# Patient Record
Sex: Male | Born: 2000 | Hispanic: No | Marital: Single
Health system: Southern US, Community
[De-identification: ages and names within clinical notes are randomized; demographics above are authoritative.]

---
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
## 2021-05-26 ENCOUNTER — Emergency Department (HOSPITAL_COMMUNITY)
Admission: EM | Admit: 2021-05-26 | Discharge: 2021-05-27 | Disposition: A | Discharge status: Home / Self Care | Payer: Medicaid Other | Attending: Emergency Medicine | Admitting: Emergency Medicine

## 2021-05-26 ENCOUNTER — Other Ambulatory Visit: Payer: Self-pay

## 2021-05-26 DIAGNOSIS — R000 Tachycardia, unspecified: Secondary | ICD-10-CM | POA: Insufficient documentation

## 2021-05-26 DIAGNOSIS — R197 Diarrhea, unspecified: Secondary | ICD-10-CM | POA: Insufficient documentation

## 2021-05-26 DIAGNOSIS — E876 Hypokalemia: Secondary | ICD-10-CM | POA: Diagnosis not present

## 2021-05-26 DIAGNOSIS — R1084 Generalized abdominal pain: Secondary | ICD-10-CM | POA: Insufficient documentation

## 2021-05-26 DIAGNOSIS — R112 Nausea with vomiting, unspecified: Secondary | ICD-10-CM | POA: Diagnosis not present

## 2021-05-26 LAB — CBC WITH DIFFERENTIAL/PLATELET
Abs Immature Granulocytes: 0.02 10*3/uL (ref 0.00–0.07)
Basophils Absolute: 0 10*3/uL (ref 0.0–0.1)
Basophils Relative: 0 %
Eosinophils Absolute: 0 10*3/uL (ref 0.0–0.5)
Eosinophils Relative: 0 %
HCT: 46.1 % (ref 39.0–52.0)
Hemoglobin: 15.8 g/dL (ref 13.0–17.0)
Immature Granulocytes: 0 %
Lymphocytes Relative: 7 %
Lymphs Abs: 0.7 10*3/uL (ref 0.7–4.0)
MCH: 29.9 pg (ref 26.0–34.0)
MCHC: 34.3 g/dL (ref 30.0–36.0)
MCV: 87.3 fL (ref 80.0–100.0)
Monocytes Absolute: 0.7 10*3/uL (ref 0.1–1.0)
Monocytes Relative: 8 %
Neutro Abs: 7.7 10*3/uL (ref 1.7–7.7)
Neutrophils Relative %: 85 %
Platelets: 277 10*3/uL (ref 150–400)
RBC: 5.28 MIL/uL (ref 4.22–5.81)
RDW: 12.8 % (ref 11.5–15.5)
WBC: 9.1 10*3/uL (ref 4.0–10.5)
nRBC: 0 % (ref 0.0–0.2)

## 2021-05-26 LAB — COMPREHENSIVE METABOLIC PANEL
ALT: 22 U/L (ref 0–44)
AST: 21 U/L (ref 15–41)
Albumin: 4.6 g/dL (ref 3.5–5.0)
Alkaline Phosphatase: 53 U/L (ref 38–126)
Anion gap: 9 (ref 5–15)
BUN: 19 mg/dL (ref 6–20)
CO2: 25 mmol/L (ref 22–32)
Calcium: 9.3 mg/dL (ref 8.9–10.3)
Chloride: 103 mmol/L (ref 98–111)
Creatinine, Ser: 0.85 mg/dL (ref 0.61–1.24)
GFR, Estimated: >60 mL/min (ref >60)
Glucose, Bld: 133 mg/dL — ABNORMAL HIGH (ref 70–99)
Potassium: 3.4 mmol/L — ABNORMAL LOW (ref 3.5–5.1)
Sodium: 137 mmol/L (ref 135–145)
Total Bilirubin: 1 mg/dL (ref 0.3–1.2)
Total Protein: 8.1 g/dL (ref 6.5–8.1)

## 2021-05-26 LAB — LIPASE, BLOOD: Lipase: 24 U/L (ref 11–51)

## 2021-05-26 NOTE — ED Provider Triage Note (Signed)
Emergency Medicine Provider Triage Evaluation Note ? ?Mitchell Farmer , a 21 y.o. male  was evaluated in triage.  Pt complains of vaginal onset, constant, epigastric abdominal pain that began last night.  Patient also complains of nausea, nonbloody nonbilious emesis, fevers with a Tmax of 101.0.  He reports that he ate a salad last night however his friend ate the same thing without symptoms.  Denies any previous abdominal surgeries. ? ?Review of Systems  ?Positive: + abd pain, nausea, vomiting, fever ?Negative: - diarrhea ? ?Physical Exam  ?BP 113/76 (BP Location: Right Arm)   Pulse (!) 128   Temp 98.6 ?F (37 ?C) (Oral)   Resp 18   SpO2 94%  ?Gen:   Awake, no distress   ?Resp:  Normal effort  ?MSK:   Moves extremities without difficulty  ?Other:  Mild epigastric abd TTP ? ?Medical Decision Making  ?Medically screening exam initiated at 9:57 PM.  Appropriate orders placed.  Jensen Nevius was informed that the remainder of the evaluation will be completed by another provider, this initial triage assessment does not replace that evaluation, and the importance of remaining in the ED until their evaluation is complete. ? ? ?  ?Eustaquio Maize, PA-C ?05/26/21 2157 ? ?

## 2021-05-26 NOTE — ED Triage Notes (Signed)
Pt states that he ate something last night and has been vomiting and having abdominal pain since then.  ?

## 2021-05-27 LAB — URINALYSIS, ROUTINE W REFLEX MICROSCOPIC
Bacteria, UA: NONE SEEN
Bilirubin Urine: NEGATIVE
Glucose, UA: NEGATIVE mg/dL
Hgb urine dipstick: NEGATIVE
Ketones, ur: NEGATIVE mg/dL
Leukocytes,Ua: NEGATIVE
Nitrite: NEGATIVE
Protein, ur: 100 mg/dL — AB
Specific Gravity, Urine: 1.033 — ABNORMAL HIGH (ref 1.005–1.030)
pH: 5 (ref 5.0–8.0)

## 2021-05-27 MED ORDER — FAMOTIDINE 20 MG PO TABS: 20.0000 mg | ORAL_TABLET | Freq: Two times a day (BID) | ORAL | 0 refills | Status: AC | PRN

## 2021-05-27 MED ORDER — LIDOCAINE VISCOUS HCL 2 % MT SOLN
15.0000 mL | Freq: Once | OROMUCOSAL | Status: AC
  Administered 2021-05-27: 15 mL via ORAL
  Filled 2021-05-27: qty 15

## 2021-05-27 MED ORDER — ONDANSETRON HCL 4 MG/2ML IJ SOLN
4.0000 mg | Freq: Once | INTRAMUSCULAR | Status: AC
  Administered 2021-05-27: 4 mg via INTRAVENOUS
  Filled 2021-05-27: qty 2

## 2021-05-27 MED ORDER — SODIUM CHLORIDE 0.9 % IV BOLUS
1000.0000 mL | Freq: Once | INTRAVENOUS | Status: AC
  Administered 2021-05-27: 1000 mL via INTRAVENOUS

## 2021-05-27 MED ORDER — POTASSIUM CHLORIDE CRYS ER 20 MEQ PO TBCR
40.0000 meq | EXTENDED_RELEASE_TABLET | Freq: Once | ORAL | Status: AC
  Administered 2021-05-27: 40 meq via ORAL
  Filled 2021-05-27: qty 2

## 2021-05-27 MED ORDER — ONDANSETRON 4 MG PO TBDP: 4.0000 mg | ORAL_TABLET | Freq: Three times a day (TID) | ORAL | 0 refills | Status: DC | PRN

## 2021-05-27 MED ORDER — ALUM & MAG HYDROXIDE-SIMETH 200-200-20 MG/5ML PO SUSP
30.0000 mL | Freq: Once | ORAL | Status: AC
  Administered 2021-05-27: 30 mL via ORAL
  Filled 2021-05-27: qty 30

## 2021-05-27 MED ORDER — FAMOTIDINE IN NACL 20-0.9 MG/50ML-% IV SOLN
20.0000 mg | Freq: Once | INTRAVENOUS | Status: AC
  Administered 2021-05-27: 20 mg via INTRAVENOUS
  Filled 2021-05-27: qty 50

## 2021-05-27 NOTE — Discharge Instructions (Addendum)
You are seen in the emergency department today with vomiting, diarrhea, and abdominal pain.  Your blood work was overall reassuring, your potassium was mildly low and you do have some protein in your urine, please have these rechecked by your primary care provider. ?We are sending you home with the following medications to help with your symptoms:  ?- Pepcid- please take 1 tablet every 12 hours as needed for abdominal pain to help with stomach acidity/pain.  ?- Zofran- please take every 8 hours as needed for nausea/vomiting.  ? ?We have prescribed you new medication(s) today. Discuss the medications prescribed today with your pharmacist as they can have adverse effects and interactions with your other medicines including over the counter and prescribed medications. Seek medical evaluation if you start to experience new or abnormal symptoms after taking one of these medicines, seek care immediately if you start to experience difficulty breathing, feeling of your throat closing, facial swelling, or rash as these could be indications of a more serious allergic reaction ? ?Please follow attached diet guidelines.  ? ?Follow up with your primary care provider within 3 days for re-evaluation.  ?Return to the ER for new or worsening symptoms including but not limited to worsened pain, new pain, pain localizing to the right side of your abdomen, inability to keep fluids down, blood in vomit/stool, passing out, or any other concerns.  ? ?

## 2021-05-27 NOTE — ED Provider Notes (Signed)
?[facility]-EMERGENCY DEPT ?Provider Note ? ? ?CSN: [id number] ?Arrival date & time: 05/26/21  2050 ? ?  ? ?History ? ?Chief Complaint  ?Patient presents with  ? Abdominal Pain  ? Emesis  ? ? ?Mitchell Farmer is a 21 y.o. male without significant past medical history who presents to the emergency department with complaints of nausea, vomiting, and diarrhea that began last night.  Patient reports approximately 30 episodes of emesis and 3-4 episodes of diarrhea since last night, he is having some associated generalized abdominal pain that is most prominent in the epigastrium.  He has had sweats and chills and his mother reports a temperature of 101 this morning.  No alleviating or aggravating factors.  He denies hematemesis, melena, hematochezia, dysuria, URI symptoms, recent foreign travel, or recent antibiotics.  Denies prior abdominal surgeries. ? ?HPI ? ?  ? ?Home Medications ?Prior to Admission medications   ?Medication Sig Start Date End Date Taking? Authorizing Provider  ?ibuprofen (ADVIL,MOTRIN) 600 MG tablet Take 1 tablet (600 mg total) by mouth every 6 (six) hours as needed. 03/19/16   Sherrilee Gilles, NP  ?   ? ?Allergies    ?Patient has no known allergies.   ? ?Review of Systems   ?Review of Systems  ?Constitutional:  Positive for fever.  ?Respiratory:  Negative for cough and shortness of breath.   ?Cardiovascular:  Negative for chest pain.  ?Gastrointestinal:  Positive for abdominal pain, diarrhea, nausea and vomiting. Negative for blood in stool.  ?Genitourinary:  Negative for dysuria.  ?Neurological:  Negative for syncope.  ?All other systems reviewed and are negative. ? ?Physical Exam ?Updated Vital Signs ?BP 124/86   Pulse (!) 119   Temp 98.6 ?F (37 ?C) (Oral)   Resp 18   SpO2 99%  ?Physical Exam ?Vitals and nursing note reviewed.  ?Constitutional:   ?   General: He is not in acute distress. ?   Appearance: He is well-developed. He is not toxic-appearing.  ?HENT:  ?    Head: Normocephalic and atraumatic.  ?Eyes:  ?   General:     ?   Right eye: No discharge.     ?   Left eye: No discharge.  ?   Conjunctiva/sclera: Conjunctivae normal.  ?Cardiovascular:  ?   Rate and Rhythm: Regular rhythm. Tachycardia present.  ?Pulmonary:  ?   Effort: No respiratory distress.  ?   Breath sounds: Normal breath sounds. No wheezing or rales.  ?Abdominal:  ?   General: There is no distension.  ?   Palpations: Abdomen is soft.  ?   Tenderness: There is abdominal tenderness (Minimal generalized without focal tenderness to palpation or peritoneal signs.). There is no guarding or rebound. Negative signs include Murphy's sign, Rovsing's sign, McBurney's sign, psoas sign and obturator sign.  ?Musculoskeletal:  ?   Cervical back: Neck supple.  ?Skin: ?   General: Skin is warm and dry.  ?Neurological:  ?   Mental Status: He is alert.  ?   Comments: Clear speech.   ?Psychiatric:     ?   Behavior: Behavior normal.  ? ? ?ED Results / Procedures / Treatments   ?Labs ?(all labs ordered are listed, but only abnormal results are displayed) ?Labs Reviewed  ?COMPREHENSIVE METABOLIC PANEL - Abnormal; Notable for the following components:  ?    Result Value  ? Potassium 3.4 (*)   ? Glucose, Bld 133 (*)   ? All other components within normal limits  ?URINALYSIS, ROUTINE  W REFLEX MICROSCOPIC - Abnormal; Notable for the following components:  ? Specific Gravity, Urine 1.033 (*)   ? Protein, ur 100 (*)   ? All other components within normal limits  ?LIPASE, BLOOD  ?CBC WITH DIFFERENTIAL/PLATELET  ? ? ?EKG ?None ? ?Radiology ?No results found. ? ?Procedures ?Procedures  ? ? ?Medications Ordered in ED ?Medications  ?sodium chloride 0.9 % bolus 1,000 mL (has no administration in time range)  ?ondansetron (ZOFRAN) injection 4 mg (has no administration in time range)  ?famotidine (PEPCID) IVPB 20 mg premix (has no administration in time range)  ? ? ?ED Course/ Medical Decision Making/ A&P ?  ?                        ?Medical  Decision Making ?Amount and/or Complexity of Data Reviewed ?Labs: ordered. ? ?Risk ?OTC drugs. ?Prescription drug management. ? ? ?Patient presents to the ED with complaints of abdominal pain. Patient nontoxic appearing, in no apparent distress, vitals with tachycardia. On exam patient tender to the generalized abdomen very mildly, no peritoneal signs.  ? ?I have ordered fluids, Pepcid, and Zofran for symptomatic management. ? ?Additional history obtained:  ?Additional history obtained from chart review & nursing note review.  Additional history from patient's mother who reported patient's temperature. ? ?Lab Tests:  ?I viewed and interpreted labs, which included:  ?CBC: Unremarkable ?CMP: Minimal hypokalemia, no critical electrolyte derangement, LFTs and renal function preserved ?Lipase: Within normal limits ?UA: elevated specific gravity.  ? ?ED Course:  ? ?I have ordered fluids, Pepcid, and Zofran for symptomatic management.I considered ordering imaging however given patient's mild tenderness that is generalized w/ Vomiting and diarrhea will hold off and plan for re-assessment.  ? ?On reassessment of the patient he is feeling much better, he is tolerating p.o, tachycardia improved. .On repeat abdominal exam patient remains without peritoneal signs, low suspicion for cholecystitis, pancreatitis, diverticulitis, appendicitis, bowel obstruction/perforation,  or other acute surgical process.  Unclear definitive etiology, possible viral GI illness.  Will discharge home with supportive measures. I discussed results, treatment plan, need for follow-up, and return precautions with the patient and parent at bedside. Provided opportunity for questions, patient and parent confirmed understanding and are in agreement with plan.  ? ?Blood pressure 123/74, pulse 95, temperature 98.6 ?F (37 ?C), temperature source Oral, resp. rate 18, SpO2 97 %.  ? ?Portions of this note were generated with Scientist, clinical (histocompatibility and immunogenetics). Dictation  errors may occur despite best attempts at proofreading. ? ? ?Final Clinical Impression(s) / ED Diagnoses ?Final diagnoses:  ?Nausea vomiting and diarrhea  ? ? ?Rx / DC Orders ?ED Discharge Orders   ? ?      Ordered  ?  ondansetron (ZOFRAN-ODT) 4 MG disintegrating tablet  Every 8 hours PRN       ? 05/27/21 0315  ?  famotidine (PEPCID) 20 MG tablet  2 times daily PRN       ? 05/27/21 0315  ? ?  ?  ? ?  ? ? ?  ?Cherly Anderson, PA-C ?05/27/21 1610 ? ?  ?Tilden Fossa, MD ?05/27/21 9604 ? ?

## 2022-05-14 ENCOUNTER — Other Ambulatory Visit: Payer: Self-pay

## 2022-05-14 ENCOUNTER — Emergency Department (HOSPITAL_COMMUNITY)
Admission: EM | Admit: 2022-05-14 | Discharge: 2022-05-15 | Disposition: A | Discharge status: Home / Self Care | Payer: Medicaid Other | Attending: Emergency Medicine | Admitting: Emergency Medicine

## 2022-05-14 ENCOUNTER — Encounter (HOSPITAL_COMMUNITY): Payer: Self-pay | Admitting: Emergency Medicine

## 2022-05-14 DIAGNOSIS — R7401 Elevation of levels of liver transaminase levels: Secondary | ICD-10-CM | POA: Diagnosis not present

## 2022-05-14 DIAGNOSIS — R109 Unspecified abdominal pain: Secondary | ICD-10-CM | POA: Diagnosis not present

## 2022-05-14 DIAGNOSIS — R7309 Other abnormal glucose: Secondary | ICD-10-CM

## 2022-05-14 DIAGNOSIS — R112 Nausea with vomiting, unspecified: Secondary | ICD-10-CM | POA: Diagnosis present

## 2022-05-14 LAB — URINALYSIS, ROUTINE W REFLEX MICROSCOPIC
Bacteria, UA: NONE SEEN
Bilirubin Urine: NEGATIVE
Glucose, UA: NEGATIVE mg/dL
Hgb urine dipstick: NEGATIVE
Ketones, ur: NEGATIVE mg/dL
Leukocytes,Ua: NEGATIVE
Nitrite: NEGATIVE
Protein, ur: 30 mg/dL — AB
Specific Gravity, Urine: 1.03 (ref 1.005–1.030)
pH: 5 (ref 5.0–8.0)

## 2022-05-14 LAB — COMPREHENSIVE METABOLIC PANEL
ALT: 52 U/L — ABNORMAL HIGH (ref 0–44)
AST: 33 U/L (ref 15–41)
Albumin: 4.8 g/dL (ref 3.5–5.0)
Alkaline Phosphatase: 59 U/L (ref 38–126)
Anion gap: 10 (ref 5–15)
BUN: 21 mg/dL — ABNORMAL HIGH (ref 6–20)
CO2: 22 mmol/L (ref 22–32)
Calcium: 9.2 mg/dL (ref 8.9–10.3)
Chloride: 106 mmol/L (ref 98–111)
Creatinine, Ser: 0.8 mg/dL (ref 0.61–1.24)
GFR, Estimated: >60 mL/min (ref >60)
Glucose, Bld: 129 mg/dL — ABNORMAL HIGH (ref 70–99)
Potassium: 3.7 mmol/L (ref 3.5–5.1)
Sodium: 138 mmol/L (ref 135–145)
Total Bilirubin: 0.8 mg/dL (ref 0.3–1.2)
Total Protein: 7.7 g/dL (ref 6.5–8.1)

## 2022-05-14 LAB — CBC
HCT: 46.6 % (ref 39.0–52.0)
Hemoglobin: 15.6 g/dL (ref 13.0–17.0)
MCH: 29.5 pg (ref 26.0–34.0)
MCHC: 33.5 g/dL (ref 30.0–36.0)
MCV: 88.3 fL (ref 80.0–100.0)
Platelets: 278 10*3/uL (ref 150–400)
RBC: 5.28 MIL/uL (ref 4.22–5.81)
RDW: 12.4 % (ref 11.5–15.5)
WBC: 10.2 10*3/uL (ref 4.0–10.5)
nRBC: 0 % (ref 0.0–0.2)

## 2022-05-14 LAB — LIPASE, BLOOD: Lipase: 24 U/L (ref 11–51)

## 2022-05-14 MED ORDER — LOPERAMIDE HCL 2 MG PO CAPS
4.0000 mg | ORAL_CAPSULE | Freq: Once | ORAL | Status: AC
  Administered 2022-05-14: 4 mg via ORAL
  Filled 2022-05-14: qty 2

## 2022-05-14 MED ORDER — SODIUM CHLORIDE 0.9 % IV BOLUS
1000.0000 mL | Freq: Once | INTRAVENOUS | Status: AC
  Administered 2022-05-14: 1000 mL via INTRAVENOUS

## 2022-05-14 MED ORDER — ONDANSETRON HCL 4 MG/2ML IJ SOLN
4.0000 mg | Freq: Once | INTRAMUSCULAR | Status: AC
  Administered 2022-05-14: 4 mg via INTRAVENOUS
  Filled 2022-05-14: qty 2

## 2022-05-14 NOTE — ED Provider Notes (Signed)
[facility] Provider Note   CSN: [id number] Arrival date & time: 05/14/22  1839     History {Add pertinent medical, surgical, social history, OB history to HPI:1} Chief Complaint  Patient presents with   Abdominal Pain   Emesis    Mitchell Farmer is a 22 y.o. male.  The history is provided by the patient.  Abdominal Pain Associated symptoms: vomiting   Emesis Associated symptoms: abdominal pain   He has no significant past history and comes in with onset last night of nausea, vomiting, diarrhea.  He estimates greater than 15 episodes of emesis, 5 episodes of diarrhea.  He denies any blood or mucus in the stool or emesis.  He has had some crampy mid abdominal pain, subjective fever as well as chills and sweats.  He denies any sick contacts.  He thinks he over ate yesterday but does not recall any specific bad food.   Home Medications Prior to Admission medications   Medication Sig Start Date End Date Taking? Authorizing Provider  famotidine (PEPCID) 20 MG tablet Take 1 tablet (20 mg total) by mouth 2 (two) times daily as needed for heartburn or indigestion (abdominal pain). 05/27/21   Petrucelli, Samantha R, PA-C  ibuprofen (ADVIL,MOTRIN) 600 MG tablet Take 1 tablet (600 mg total) by mouth every 6 (six) hours as needed. 03/19/16   Jean Rosenthal, NP  ondansetron (ZOFRAN-ODT) 4 MG disintegrating tablet Take 1 tablet (4 mg total) by mouth every 8 (eight) hours as needed for nausea or vomiting. 05/27/21   Petrucelli, Glynda Jaeger, PA-C      Allergies    Patient has no known allergies.    Review of Systems   Review of Systems  Gastrointestinal:  Positive for abdominal pain and vomiting.  All other systems reviewed and are negative.   Physical Exam Updated Vital Signs BP 128/67   Pulse (!) 129   Temp 98.7 F (37.1 C) (Oral)   Resp 19   SpO2 98%  Physical Exam Vitals and nursing note reviewed.   22 year old male, resting  comfortably and in no acute distress. Vital signs are significant for elevated heart rate. Oxygen saturation is 98%, which is normal. Head is normocephalic and atraumatic. PERRLA, EOMI. Oropharynx is clear. Neck is nontender and supple without adenopathy or JVD. Back is nontender and there is no CVA tenderness. Lungs are clear without rales, wheezes, or rhonchi. Chest is nontender. Heart is tachycardic without murmur. Abdomen is soft, flat, nontender. Extremities have no cyanosis or edema, full range of motion is present. Skin is warm and dry without rash. Neurologic: Mental status is normal, cranial nerves are intact, moves all extremities equally.  ED Results / Procedures / Treatments   Labs (all labs ordered are listed, but only abnormal results are displayed) Labs Reviewed  COMPREHENSIVE METABOLIC PANEL - Abnormal; Notable for the following components:      Result Value   Glucose, Bld 129 (*)    BUN 21 (*)    ALT 52 (*)    All other components within normal limits  URINALYSIS, ROUTINE W REFLEX MICROSCOPIC - Abnormal; Notable for the following components:   Color, Urine AMBER (*)    Protein, ur 30 (*)    All other components within normal limits  LIPASE, BLOOD  CBC    EKG None  Radiology No results found.  Procedures Procedures  {Document cardiac monitor, telemetry assessment procedure when appropriate:1}  Medications Ordered in ED Medications  sodium chloride 0.9 % bolus 1,000 mL (has no administration in time range)  ondansetron (ZOFRAN) injection 4 mg (has no administration in time range)  loperamide (IMODIUM) capsule 4 mg (has no administration in time range)    ED Course/ Medical Decision Making/ A&P   {   Click here for ABCD2, HEART and other calculatorsREFRESH Note before signing :1}                          Medical Decision Making Risk Prescription drug management.   Nausea, vomiting, diarrhea which is most likely viral gastroenteritis, consider  food poisoning.  Doubt more serious pathology such as bowel obstruction, diverticulitis.  I have reviewed and interpreted his laboratory test, and my interpretation is normal electrolytes, elevated random glucose level, minimally elevated ALT of uncertain clinical significance, normal CBC.  I have ordered IV fluids, ondansetron for nausea, oral loperamide for diarrhea.  {Document critical care time when appropriate:1} {Document review of labs and clinical decision tools ie heart score, Chads2Vasc2 etc:1}  {Document your independent review of radiology images, and any outside records:1} {Document your discussion with family members, caretakers, and with consultants:1} {Document social determinants of health affecting pt's care:1} {Document your decision making why or why not admission, treatments were needed:1} Final Clinical Impression(s) / ED Diagnoses Final diagnoses:  None    Rx / DC Orders ED Discharge Orders     None

## 2022-05-14 NOTE — ED Triage Notes (Signed)
Pt reports N/V/D since this morning. Reports abd pain as well. States this began after eating burger last night.

## 2022-05-14 NOTE — ED Provider Triage Note (Signed)
Emergency Medicine Provider Triage Evaluation Note  Mitchell Farmer , a 22 y.o. male  was evaluated in triage.  Pt complains of abd pain, nausea, vomiting, diarrhea since last night. Ate a burger and chicken sandwich when he got home from work. Vomitted about 15 times per day. No significant PMH.   Review of Systems  Positive: Abd pain, fever, nausea, vomiting, diarrhea Negative: Urinary sx  Physical Exam  BP 128/67   Pulse (!) 129   Temp 98.7 F (37.1 C) (Oral)   Resp 19   SpO2 98%  Gen:   Awake, no distress   Resp:  Normal effort  MSK:   Moves extremities without difficulty  Other:    Medical Decision Making  Medically screening exam initiated at 7:27 PM.  Appropriate orders placed.  Mitchell Farmer was informed that the remainder of the evaluation will be completed by another provider, this initial triage assessment does not replace that evaluation, and the importance of remaining in the ED until their evaluation is complete.  Workup initiated   Mitchell Farmer 05/14/22 1927

## 2022-05-15 MED ORDER — ONDANSETRON 8 MG PO TBDP: 8.0000 mg | ORAL_TABLET | Freq: Three times a day (TID) | ORAL | 0 refills | Status: AC | PRN

## 2022-05-15 NOTE — Discharge Instructions (Addendum)
Take loperamide (Imodium A-D) as needed for diarrhea.  Your blood sugar was a little high today.  This may be what is sometimes referred to as prediabetes.  This is something that your primary care provider will need to check on periodically.  It needs to be watched closely so that if you do become diabetic, treatment can be started immediately.

## 2024-02-13 ENCOUNTER — Other Ambulatory Visit: Payer: Self-pay

## 2024-02-13 ENCOUNTER — Emergency Department (HOSPITAL_BASED_OUTPATIENT_CLINIC_OR_DEPARTMENT_OTHER): Admission: EM | Admit: 2024-02-13 | Discharge: 2024-02-13 | Disposition: A | Discharge status: Home / Self Care

## 2024-02-13 ENCOUNTER — Encounter (HOSPITAL_BASED_OUTPATIENT_CLINIC_OR_DEPARTMENT_OTHER): Payer: Self-pay

## 2024-02-13 DIAGNOSIS — B349 Viral infection, unspecified: Secondary | ICD-10-CM | POA: Insufficient documentation

## 2024-02-13 DIAGNOSIS — R110 Nausea: Secondary | ICD-10-CM

## 2024-02-13 DIAGNOSIS — R509 Fever, unspecified: Secondary | ICD-10-CM | POA: Diagnosis present

## 2024-02-13 MED ORDER — ONDANSETRON HCL 4 MG PO TABS: 4.0000 mg | ORAL_TABLET | Freq: Three times a day (TID) | ORAL | 0 refills | Status: AC | PRN

## 2024-02-13 MED ORDER — PROMETHAZINE-DM 6.25-15 MG/5ML PO SYRP: 5.0000 mL | ORAL_SOLUTION | Freq: Four times a day (QID) | ORAL | 0 refills | Status: AC | PRN

## 2024-02-13 MED ORDER — ACETAMINOPHEN 500 MG PO TABS
1000.0000 mg | ORAL_TABLET | Freq: Once | ORAL | Status: AC
  Administered 2024-02-13: 1000 mg via ORAL
  Filled 2024-02-13: qty 2

## 2024-02-13 MED ORDER — ONDANSETRON 4 MG PO TBDP
4.0000 mg | ORAL_TABLET | Freq: Once | ORAL | Status: AC
  Administered 2024-02-13: 4 mg via ORAL
  Filled 2024-02-13: qty 1

## 2024-02-13 NOTE — ED Provider Notes (Signed)
 " [place] [facility] AT [facility] Provider Note   CSN: [id number] Arrival date & time: 02/13/24  2212     Patient presents with: Influenza   Braxtyn Bojarski is a 23 y.o. male.   23 year old male presents for evaluation of flulike symptoms.  States he has had a fever for 3 days so some nausea coughing.  States he has been using Tylenol  Motrin  as needed for fever.  He admits to sick contacts.  Has also had a runny nose.  Denies any other symptoms or concerns at this time.   Influenza Presenting symptoms: cough, fatigue, fever and nausea   Presenting symptoms: no shortness of breath, no sore throat and no vomiting   Associated symptoms: no chills and no ear pain        Prior to Admission medications  Medication Sig Start Date End Date Taking? Authorizing Provider  ondansetron  (ZOFRAN ) 4 MG tablet Take 1 tablet (4 mg total) by mouth every 8 (eight) hours as needed for up to 4 days. 02/13/24 02/17/24 Yes Bailley Guilford L, DO  promethazine-dextromethorphan (PROMETHAZINE-DM) 6.25-15 MG/5ML syrup Take 5 mLs by mouth 4 (four) times daily as needed for up to 5 days for cough. 02/13/24 02/18/24 Yes Trygve Thal L, DO  famotidine  (PEPCID ) 20 MG tablet Take 1 tablet (20 mg total) by mouth 2 (two) times daily as needed for heartburn or indigestion (abdominal pain). 05/27/21   Petrucelli, Samantha R, PA-C  ibuprofen  (ADVIL ,MOTRIN ) 600 MG tablet Take 1 tablet (600 mg total) by mouth every 6 (six) hours as needed. 03/19/16   Everlean Laymon SAILOR, NP  ondansetron  (ZOFRAN -ODT) 8 MG disintegrating tablet Take 1 tablet (8 mg total) by mouth every 8 (eight) hours as needed for nausea or vomiting. 05/15/22   Raford Lenis, MD    Allergies: Patient has no known allergies.    Review of Systems  Constitutional:  Positive for fatigue and fever. Negative for chills.  HENT:  Negative for ear pain and sore throat.   Eyes:  Negative for pain and visual disturbance.  Respiratory:   Positive for cough. Negative for shortness of breath.   Cardiovascular:  Negative for chest pain and palpitations.  Gastrointestinal:  Positive for nausea. Negative for abdominal pain and vomiting.  Genitourinary:  Negative for dysuria and hematuria.  Musculoskeletal:  Negative for arthralgias and back pain.  Skin:  Negative for color change and rash.  Neurological:  Negative for seizures and syncope.  All other systems reviewed and are negative.   Updated Vital Signs BP 130/77 (BP Location: Right Arm)   Pulse (!) 131   Temp (!) 102.8 F (39.3 C) (Oral)   Resp (!) 22   SpO2 99%   Physical Exam Vitals and nursing note reviewed.  Constitutional:      General: He is not in acute distress.    Appearance: Normal appearance. He is well-developed. He is not ill-appearing.  HENT:     Head: Normocephalic and atraumatic.  Eyes:     Conjunctiva/sclera: Conjunctivae normal.  Cardiovascular:     Rate and Rhythm: Normal rate and regular rhythm.     Heart sounds: No murmur heard. Pulmonary:     Effort: Pulmonary effort is normal. No respiratory distress.     Breath sounds: Normal breath sounds.  Abdominal:     Palpations: Abdomen is soft.     Tenderness: There is no abdominal tenderness.  Musculoskeletal:        General: No swelling.     Cervical  back: Neck supple.  Skin:    General: Skin is warm and dry.     Capillary Refill: Capillary refill takes less than 2 seconds.  Neurological:     General: No focal deficit present.     Mental Status: He is alert.  Psychiatric:        Mood and Affect: Mood normal.     (all labs ordered are listed, but only abnormal results are displayed) Labs Reviewed - No data to display  EKG: None  Radiology: No results found.   Procedures   Medications Ordered in the ED  ondansetron  (ZOFRAN -ODT) disintegrating tablet 4 mg (4 mg Oral Given 02/13/24 2226)  acetaminophen  (TYLENOL ) tablet 1,000 mg (1,000 mg Oral Given 02/13/24 2226)                                     Medical Decision Making Patient here for viral syndrome.  Likely flu has had a recent sick contact.  Unfortunately we are out of flu swabs at this time.  Advised Tylenol  and Motrin  as needed for pain and fever and will give prescription for cough medicine and Zofran  for nausea.  Was given Tylenol  and Zofran  in the emergency department.  Advise follow-up with primary care doctor as needed otherwise return to the ER for new or worsening symptoms.  Patient feels comfortable.  Discharged home.  Problems Addressed: Nausea: acute illness or injury Viral syndrome: acute illness or injury  Amount and/or Complexity of Data Reviewed External Data Reviewed: notes.    Details: Prior ED records reviewed and patient seen 04-17-2022 for nausea and vomiting  Risk OTC drugs. Prescription drug management.     Final diagnoses:  Viral syndrome  Nausea    ED Discharge Orders          Ordered    ondansetron  (ZOFRAN ) 4 MG tablet  Every 8 hours PRN        02/13/24 2244    promethazine-dextromethorphan (PROMETHAZINE-DM) 6.25-15 MG/5ML syrup  4 times daily PRN        02/13/24 2244               Graiden Henes L, DO 02/13/24 2247  "

## 2024-02-13 NOTE — Discharge Instructions (Addendum)
 You can alternate Tylenol  and Motrin  as needed for pain and fever.  You can use any other over-the-counter medications as needed for your symptoms.  Take your Zofran  as needed for nausea and vomiting and take your cough medicine as needed for cough.

## 2024-02-13 NOTE — ED Triage Notes (Addendum)
 Pt presents via POV c/o flu-like symptoms. Reports has intermittent fevers prompting him to come to ED. Took 600mg  Motrin  30 min PTA. Also reports abd pain, diarrhea. Taking OTC Theraflu per mother.

## 2024-07-25 ENCOUNTER — Ambulatory Visit: Admitting: Dermatology
